# Patient Record
Sex: Male | Born: 2005 | Race: Black or African American | Hispanic: No | Marital: Single | Smoking: Current every day smoker
Health system: Southern US, Community
[De-identification: ages and names within clinical notes are randomized; demographics above are authoritative.]

## PROBLEM LIST (undated history)

## (undated) DIAGNOSIS — K0889 Other specified disorders of teeth and supporting structures: Secondary | ICD-10-CM

## (undated) DIAGNOSIS — H6120 Impacted cerumen, unspecified ear: Secondary | ICD-10-CM

## (undated) HISTORY — PX: MASTOIDECTOMY: SHX711

---
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
## 2023-01-03 DIAGNOSIS — Z419 Encounter for procedure for purposes other than remedying health state, unspecified: Secondary | ICD-10-CM | POA: Diagnosis not present

## 2023-02-02 DIAGNOSIS — Z419 Encounter for procedure for purposes other than remedying health state, unspecified: Secondary | ICD-10-CM | POA: Diagnosis not present

## 2023-03-05 DIAGNOSIS — Z419 Encounter for procedure for purposes other than remedying health state, unspecified: Secondary | ICD-10-CM | POA: Diagnosis not present

## 2023-04-04 DIAGNOSIS — Z419 Encounter for procedure for purposes other than remedying health state, unspecified: Secondary | ICD-10-CM | POA: Diagnosis not present

## 2023-05-05 DIAGNOSIS — Z419 Encounter for procedure for purposes other than remedying health state, unspecified: Secondary | ICD-10-CM | POA: Diagnosis not present

## 2023-06-05 DIAGNOSIS — Z419 Encounter for procedure for purposes other than remedying health state, unspecified: Secondary | ICD-10-CM | POA: Diagnosis not present

## 2023-07-03 DIAGNOSIS — Z419 Encounter for procedure for purposes other than remedying health state, unspecified: Secondary | ICD-10-CM | POA: Diagnosis not present

## 2023-08-14 DIAGNOSIS — Z419 Encounter for procedure for purposes other than remedying health state, unspecified: Secondary | ICD-10-CM | POA: Diagnosis not present

## 2023-09-13 DIAGNOSIS — Z419 Encounter for procedure for purposes other than remedying health state, unspecified: Secondary | ICD-10-CM | POA: Diagnosis not present

## 2023-10-13 ENCOUNTER — Ambulatory Visit: Admitting: Family Medicine

## 2023-10-14 DIAGNOSIS — Z419 Encounter for procedure for purposes other than remedying health state, unspecified: Secondary | ICD-10-CM | POA: Diagnosis not present

## 2023-11-04 ENCOUNTER — Encounter: Payer: Self-pay | Admitting: Family Medicine

## 2023-11-04 ENCOUNTER — Ambulatory Visit (INDEPENDENT_AMBULATORY_CARE_PROVIDER_SITE_OTHER): Admitting: Family Medicine

## 2023-11-04 VITALS — BP 111/63 | HR 73 | Ht 69.29 in | Wt 155.8 lb

## 2023-11-04 DIAGNOSIS — Z7689 Persons encountering health services in other specified circumstances: Secondary | ICD-10-CM

## 2023-11-04 DIAGNOSIS — R634 Abnormal weight loss: Secondary | ICD-10-CM | POA: Diagnosis not present

## 2023-11-04 DIAGNOSIS — Z1159 Encounter for screening for other viral diseases: Secondary | ICD-10-CM

## 2023-11-04 DIAGNOSIS — M67439 Ganglion, unspecified wrist: Secondary | ICD-10-CM

## 2023-11-04 DIAGNOSIS — R630 Anorexia: Secondary | ICD-10-CM | POA: Diagnosis not present

## 2023-11-04 DIAGNOSIS — Z113 Encounter for screening for infections with a predominantly sexual mode of transmission: Secondary | ICD-10-CM

## 2023-11-04 DIAGNOSIS — R5383 Other fatigue: Secondary | ICD-10-CM

## 2023-11-04 DIAGNOSIS — R7309 Other abnormal glucose: Secondary | ICD-10-CM | POA: Diagnosis not present

## 2023-11-04 NOTE — Progress Notes (Signed)
 New Patient Office Visit  Subjective   Patient ID: Michael Shaffer, male    DOB: [birth year]  Age: 18 y.o. MRN: [id number]  CC:  Chief Complaint  Patient presents with   Establish Care    HPI Michael Shaffer presents to establish care  Subjective - Reports bump on arm present for 2-3 years, previously painful but currently asymptomatic - Significant unintentional weight loss with decreased appetite, gets full easily, sometimes doesn't eat for entire day - Previously could eat two full plates, now can only eat half plate - Denies depression, states going through difficult times but not daily depression - Denies nausea, vomiting, diarrhea, or constipation - Reports poor sleep - Requests STI testing, denies symptoms of discharge, itching, or burning - Requests blood pressure check due to weight loss  Medications: None mentioned.  PMHx: Allergies. PSHx: Ear tubes, finger surgery with stitches. FHx: Grandfather with diabetes, denies family history of cancer in grandparents or parents. Social Hx: Works in Aeronautical engineer business, tobacco use (cigars), denies alcohol use, marijuana use, denies other recreational drugs, in relationship with one partner, sexually active.  ROS: Denies depression, nausea, vomiting, diarrhea, constipation, urinary symptoms.  Outpatient Encounter Medications as of 11/04/2023  Medication Sig   cetirizine  (ZYRTEC  ALLERGY) 10 MG tablet Take 1 tablet (10 mg total) by mouth daily.   ciprofloxacin -dexamethasone  (CIPRODEX ) otic suspension 4 drops 2 (two) times daily.   Olopatadine  HCl 0.2 % SOLN Apply 1 drop to eye daily.   No facility-administered encounter medications on file as of 11/04/2023.    Past Medical History:  Diagnosis Date   Impacted ear wax 08/2011   Tooth loose 08/28/2011   1 lower tooth    Past Surgical History:  Procedure Laterality Date   MASTOIDECTOMY     MYRINGOTOMY  08/31/2011   Procedure: MYRINGOTOMY;  Surgeon: Ana LELON Moccasin, MD;  Location:  [facility];  Service: ENT;  Laterality: Bilateral;  ENT exam under anesthesia; removal of old tube from Left ear with tube placement Left ear    Family History  Problem Relation Age of Onset   Hypertension Mother    Diabetes Maternal Grandfather     Social History   Socioeconomic History   Marital status: Single    Spouse name: Not on file   Number of children: Not on file   Years of education: Not on file   Highest education level: Not on file  Occupational History   Not on file  Tobacco Use   Smoking status: Every Day    Types: Cigarettes    Passive exposure: Yes   Smokeless tobacco: Never   Tobacco comments:    mother smokes outside  Substance and Sexual Activity   Alcohol use: Not on file   Drug use: Not on file   Sexual activity: Not on file  Other Topics Concern   Not on file  Social History Narrative   Not on file   Social Drivers of Health   Financial Resource Strain: Not on file  Food Insecurity: Not on file  Transportation Needs: Not on file  Physical Activity: Not on file  Stress: Not on file  Social Connections: Not on file  Intimate Partner Violence: Not on file    ROS     Objective   BP 111/63   Pulse 73   Ht 5' 9.29 (1.76 m)   Wt 155 lb 12 oz (70.6 kg)   SpO2 98%   BMI 22.81 kg/m   Physical Exam Gen: alert,  oriented Heent: perrla, eomi Cv: rrr Pulm: lctab Gi: soft, nontender.   Msk: mobile mass on dorsal wrist     Assessment & Plan:   Encounter to establish care  Routine screening for STI (sexually transmitted infection) -     GC/Chlamydia Probe Amp -     RPR -     HIV Antibody (routine testing w rflx)  Encounter for hepatitis C screening test for low risk patient -     Hepatitis C antibody  Decreased appetite Assessment & Plan: Unintentional weight loss with decreased appetite and early satiety, denies associated gastrointestinal symptoms or depression. - Laboratory studies to evaluate etiology of  weight loss and decreased appetite - Consider abdominal ultrasound pending lab results - Follow-up in 2 months  Orders: -     TSH -     Comprehensive metabolic panel with GFR -     CBC with Differential/Platelet  Weight loss -     TSH -     Comprehensive metabolic panel with GFR -     CBC with Differential/Platelet  Elevated hemoglobin A1c -     Lipid panel -     Hemoglobin A1c  Other fatigue -     Iron, TIBC and Ferritin Panel -     B12 and Folate Panel  Ganglion of wrist, unspecified laterality Assessment & Plan: cyst present for 2-3 years, previously painful now asymptomatic, consistent with cyst on examination. Counseled that cysts are generally benign collections of fluid, may resolve spontaneously or with trauma, drainage alone typically results in recurrence. - conservative mgmt    Screening examination for STI Assessment & Plan: STI screening requested, denies symptoms. - Blood tests for syphilis, HIV, hepatitis C - Urine testing for gonorrhea and chlamydia     Return in about 2 months (around 01/05/2024) for weight loss, fatigue.   Toribio MARLA Slain, MD

## 2023-11-04 NOTE — Patient Instructions (Signed)
 It was nice to see you today,  We addressed the following topics today: -If you want further evaluation of the cyst on your wrist I can order an ultrasound and then if you want it removed we can send you to a surgeon - I am ordering some labs to look up causes of your decreased appetite.  I will let you know the results when I get them.  We can discuss it further at your next visit - I am ordering some test for sexually transmitted infections.  I will let you know the results when I get them.  Have a great day,  Rolan Slain, MD

## 2023-11-05 DIAGNOSIS — Z113 Encounter for screening for infections with a predominantly sexual mode of transmission: Secondary | ICD-10-CM | POA: Insufficient documentation

## 2023-11-05 DIAGNOSIS — R5383 Other fatigue: Secondary | ICD-10-CM | POA: Insufficient documentation

## 2023-11-05 DIAGNOSIS — R634 Abnormal weight loss: Secondary | ICD-10-CM | POA: Insufficient documentation

## 2023-11-05 DIAGNOSIS — M67439 Ganglion, unspecified wrist: Secondary | ICD-10-CM | POA: Insufficient documentation

## 2023-11-05 DIAGNOSIS — R630 Anorexia: Secondary | ICD-10-CM | POA: Insufficient documentation

## 2023-11-05 DIAGNOSIS — R7309 Other abnormal glucose: Secondary | ICD-10-CM | POA: Insufficient documentation

## 2023-11-05 LAB — HIV ANTIBODY (ROUTINE TESTING W REFLEX): HIV Screen 4th Generation wRfx: NONREACTIVE

## 2023-11-05 LAB — CBC WITH DIFFERENTIAL/PLATELET
Basophils Absolute: 0 x10E3/uL (ref 0.0–0.2)
Basos: 1 %
EOS (ABSOLUTE): 0.2 x10E3/uL (ref 0.0–0.4)
Eos: 6 %
Hematocrit: 43.8 % (ref 37.5–51.0)
Hemoglobin: 13.9 g/dL (ref 13.0–17.7)
Immature Grans (Abs): 0 x10E3/uL (ref 0.0–0.1)
Immature Granulocytes: 0 %
Lymphocytes Absolute: 1.6 x10E3/uL (ref 0.7–3.1)
Lymphs: 42 %
MCH: 28.2 pg (ref 26.6–33.0)
MCHC: 31.7 g/dL (ref 31.5–35.7)
MCV: 89 fL (ref 79–97)
Monocytes Absolute: 0.3 x10E3/uL (ref 0.1–0.9)
Monocytes: 7 %
Neutrophils Absolute: 1.6 x10E3/uL (ref 1.4–7.0)
Neutrophils: 44 %
Platelets: 241 x10E3/uL (ref 150–450)
RBC: 4.93 x10E6/uL (ref 4.14–5.80)
RDW: 13 % (ref 11.6–15.4)
WBC: 3.7 x10E3/uL (ref 3.4–10.8)

## 2023-11-05 LAB — COMPREHENSIVE METABOLIC PANEL WITH GFR
ALT: 16 IU/L (ref 0–44)
AST: 21 IU/L (ref 0–40)
Albumin: 4.7 g/dL (ref 4.3–5.2)
Alkaline Phosphatase: 52 IU/L (ref 51–125)
BUN/Creatinine Ratio: 14 (ref 9–20)
BUN: 15 mg/dL (ref 6–20)
Bilirubin Total: 0.3 mg/dL (ref 0.0–1.2)
CO2: 23 mmol/L (ref 20–29)
Calcium: 9.8 mg/dL (ref 8.7–10.2)
Chloride: 104 mmol/L (ref 96–106)
Creatinine, Ser: 1.09 mg/dL (ref 0.76–1.27)
Globulin, Total: 2.4 g/dL (ref 1.5–4.5)
Glucose: 53 mg/dL — ABNORMAL LOW (ref 70–99)
Potassium: 5 mmol/L (ref 3.5–5.2)
Sodium: 143 mmol/L (ref 134–144)
Total Protein: 7.1 g/dL (ref 6.0–8.5)
eGFR: 100 mL/min/1.73 (ref >59)

## 2023-11-05 LAB — B12 AND FOLATE PANEL
Folate: 6.7 ng/mL (ref >3.0)
Vitamin B-12: 552 pg/mL (ref 232–1245)

## 2023-11-05 LAB — HEMOGLOBIN A1C
Est. average glucose Bld gHb Est-mCnc: 114 mg/dL
Hgb A1c MFr Bld: 5.6 % (ref 4.8–5.6)

## 2023-11-05 LAB — LIPID PANEL
Chol/HDL Ratio: 3.1 ratio (ref 0.0–5.0)
Cholesterol, Total: 156 mg/dL (ref 100–169)
HDL: 51 mg/dL (ref >39)
LDL Chol Calc (NIH): 93 mg/dL (ref 0–109)
Triglycerides: 60 mg/dL (ref 0–89)
VLDL Cholesterol Cal: 12 mg/dL (ref 5–40)

## 2023-11-05 LAB — TSH: TSH: 0.411 u[IU]/mL — ABNORMAL LOW (ref 0.450–4.500)

## 2023-11-05 LAB — RPR: RPR Ser Ql: NONREACTIVE

## 2023-11-05 LAB — IRON,TIBC AND FERRITIN PANEL
Ferritin: 120 ng/mL (ref 16–124)
Iron Saturation: 28 % (ref 15–55)
Iron: 79 ug/dL (ref 38–169)
Total Iron Binding Capacity: 279 ug/dL (ref 250–450)
UIBC: 200 ug/dL (ref 111–343)

## 2023-11-05 LAB — HEPATITIS C ANTIBODY: Hep C Virus Ab: NONREACTIVE

## 2023-11-05 NOTE — Assessment & Plan Note (Signed)
 STI screening requested, denies symptoms. - Blood tests for syphilis, HIV, hepatitis C - Urine testing for gonorrhea and chlamydia

## 2023-11-05 NOTE — Assessment & Plan Note (Signed)
 Unintentional weight loss with decreased appetite and early satiety, denies associated gastrointestinal symptoms or depression. - Laboratory studies to evaluate etiology of weight loss and decreased appetite - Consider abdominal ultrasound pending lab results - Follow-up in 2 months

## 2023-11-05 NOTE — Assessment & Plan Note (Signed)
 cyst present for 2-3 years, previously painful now asymptomatic, consistent with cyst on examination. Counseled that cysts are generally benign collections of fluid, may resolve spontaneously or with trauma, drainage alone typically results in recurrence. - conservative mgmt

## 2023-11-06 LAB — GC/CHLAMYDIA PROBE AMP
Chlamydia trachomatis, NAA: NEGATIVE
Neisseria Gonorrhoeae by PCR: NEGATIVE

## 2023-11-08 ENCOUNTER — Ambulatory Visit: Payer: Self-pay | Admitting: Family Medicine

## 2023-11-08 DIAGNOSIS — R7989 Other specified abnormal findings of blood chemistry: Secondary | ICD-10-CM

## 2023-11-08 NOTE — Telephone Encounter (Signed)
 Pt is now scheduled for 12/09/23 for lab recheck.

## 2023-11-08 NOTE — Telephone Encounter (Deleted)
-----   Message from Toribio MARLA Slain sent at 11/08/2023  7:39 AM EDT ----- Please let the patient know his TSH was slightly low, meaning he could have hyper thyroid disorder.  I will need to recheck it in about 1 month with some additional testing.  If he does have  hyperthyroid this could explain some of his weight loss.    His other tests, including tests for diabetes, were normal.   ----- Message ----- From: Interface, [facility] Lab Results In Sent: 11/05/2023   7:12 AM EDT To: Toribio MARLA Slain, MD

## 2023-11-13 DIAGNOSIS — Z419 Encounter for procedure for purposes other than remedying health state, unspecified: Secondary | ICD-10-CM | POA: Diagnosis not present

## 2023-12-09 ENCOUNTER — Other Ambulatory Visit

## 2023-12-09 DIAGNOSIS — R7989 Other specified abnormal findings of blood chemistry: Secondary | ICD-10-CM

## 2023-12-14 DIAGNOSIS — Z419 Encounter for procedure for purposes other than remedying health state, unspecified: Secondary | ICD-10-CM | POA: Diagnosis not present

## 2024-01-14 DIAGNOSIS — Z419 Encounter for procedure for purposes other than remedying health state, unspecified: Secondary | ICD-10-CM | POA: Diagnosis not present

## 2024-01-25 ENCOUNTER — Ambulatory Visit: Admitting: Family Medicine

## 2024-03-15 DIAGNOSIS — Z419 Encounter for procedure for purposes other than remedying health state, unspecified: Secondary | ICD-10-CM | POA: Diagnosis not present

## 2024-03-16 DIAGNOSIS — Z113 Encounter for screening for infections with a predominantly sexual mode of transmission: Secondary | ICD-10-CM | POA: Diagnosis not present

## 2024-03-27 ENCOUNTER — Ambulatory Visit: Admitting: Family Medicine
